# Patient Record
Sex: Female | Born: 1975 | Race: Black or African American | Hispanic: No | Marital: Single
Health system: Southern US, Community
[De-identification: ages and names within clinical notes are randomized; demographics above are authoritative.]

---

## 1997-08-04 ENCOUNTER — Other Ambulatory Visit: Admission: RE | Admit: 1997-08-04 | Discharge: 1997-08-04 | Payer: Self-pay | Admitting: Urology

## 1999-02-28 ENCOUNTER — Encounter: Payer: Self-pay | Admitting: Oral & Maxillofacial Surgery

## 1999-02-28 ENCOUNTER — Ambulatory Visit (HOSPITAL_COMMUNITY): Admission: RE | Admit: 1999-02-28 | Discharge: 1999-02-28 | Payer: Self-pay | Admitting: Oral & Maxillofacial Surgery

## 2001-05-31 ENCOUNTER — Inpatient Hospital Stay (HOSPITAL_COMMUNITY): Admission: EM | Admit: 2001-05-31 | Discharge: 2001-06-03 | Payer: Self-pay | Admitting: Emergency Medicine

## 2001-08-04 ENCOUNTER — Emergency Department (HOSPITAL_COMMUNITY): Admission: EM | Admit: 2001-08-04 | Discharge: 2001-08-04 | Payer: Self-pay | Admitting: Emergency Medicine

## 2001-08-21 ENCOUNTER — Encounter: Admission: RE | Admit: 2001-08-21 | Discharge: 2001-08-21 | Payer: Self-pay | Admitting: Internal Medicine

## 2001-12-19 ENCOUNTER — Observation Stay (HOSPITAL_COMMUNITY): Admission: RE | Admit: 2001-12-19 | Discharge: 2001-12-20 | Payer: Self-pay | Admitting: Internal Medicine

## 2002-08-15 ENCOUNTER — Encounter: Payer: Self-pay | Admitting: Emergency Medicine

## 2002-08-15 ENCOUNTER — Emergency Department (HOSPITAL_COMMUNITY): Admission: EM | Admit: 2002-08-15 | Discharge: 2002-08-16 | Payer: Self-pay | Admitting: Emergency Medicine

## 2006-09-11 ENCOUNTER — Other Ambulatory Visit: Admission: RE | Admit: 2006-09-11 | Discharge: 2006-09-11 | Payer: Self-pay | Admitting: Obstetrics and Gynecology

## 2007-11-05 ENCOUNTER — Ambulatory Visit (HOSPITAL_COMMUNITY): Admission: RE | Admit: 2007-11-05 | Discharge: 2007-11-05 | Payer: Self-pay | Admitting: Internal Medicine

## 2008-09-10 ENCOUNTER — Ambulatory Visit: Payer: Self-pay | Admitting: Internal Medicine

## 2008-09-10 ENCOUNTER — Inpatient Hospital Stay (HOSPITAL_COMMUNITY): Admission: EM | Admit: 2008-09-10 | Discharge: 2008-09-18 | Payer: Self-pay | Admitting: Emergency Medicine

## 2008-09-23 ENCOUNTER — Inpatient Hospital Stay (HOSPITAL_COMMUNITY): Admission: EM | Admit: 2008-09-23 | Discharge: 2008-10-08 | Payer: Self-pay | Admitting: Emergency Medicine

## 2008-09-23 ENCOUNTER — Encounter: Payer: Self-pay | Admitting: Internal Medicine

## 2008-09-23 ENCOUNTER — Ambulatory Visit: Payer: Self-pay | Admitting: Cardiology

## 2008-09-23 DIAGNOSIS — E876 Hypokalemia: Secondary | ICD-10-CM

## 2008-09-23 DIAGNOSIS — J189 Pneumonia, unspecified organism: Secondary | ICD-10-CM

## 2008-09-23 DIAGNOSIS — G8250 Quadriplegia, unspecified: Secondary | ICD-10-CM

## 2008-09-23 DIAGNOSIS — A4901 Methicillin susceptible Staphylococcus aureus infection, unspecified site: Secondary | ICD-10-CM | POA: Insufficient documentation

## 2008-09-23 DIAGNOSIS — J9610 Chronic respiratory failure, unspecified whether with hypoxia or hypercapnia: Secondary | ICD-10-CM

## 2008-10-10 ENCOUNTER — Telehealth: Payer: Self-pay | Admitting: Internal Medicine

## 2008-10-14 ENCOUNTER — Telehealth (INDEPENDENT_AMBULATORY_CARE_PROVIDER_SITE_OTHER): Payer: Self-pay | Admitting: *Deleted

## 2008-10-22 ENCOUNTER — Ambulatory Visit: Payer: Self-pay | Admitting: Internal Medicine

## 2008-10-22 ENCOUNTER — Encounter: Payer: Self-pay | Admitting: Critical Care Medicine

## 2008-10-22 ENCOUNTER — Ambulatory Visit (HOSPITAL_COMMUNITY): Admission: RE | Admit: 2008-10-22 | Discharge: 2008-10-22 | Payer: Self-pay | Admitting: *Deleted

## 2008-10-30 ENCOUNTER — Telehealth: Payer: Self-pay | Admitting: Critical Care Medicine

## 2008-11-03 ENCOUNTER — Encounter: Payer: Self-pay | Admitting: Internal Medicine

## 2008-11-04 ENCOUNTER — Encounter: Payer: Self-pay | Admitting: Critical Care Medicine

## 2008-11-16 ENCOUNTER — Encounter: Payer: Self-pay | Admitting: Critical Care Medicine

## 2008-11-24 ENCOUNTER — Encounter: Payer: Self-pay | Admitting: Internal Medicine

## 2008-12-15 ENCOUNTER — Encounter: Payer: Self-pay | Admitting: Critical Care Medicine

## 2008-12-19 ENCOUNTER — Encounter: Payer: Self-pay | Admitting: Critical Care Medicine

## 2008-12-22 ENCOUNTER — Ambulatory Visit: Payer: Self-pay | Admitting: Internal Medicine

## 2009-01-08 ENCOUNTER — Telehealth: Payer: Self-pay | Admitting: Internal Medicine

## 2009-01-12 ENCOUNTER — Telehealth: Payer: Self-pay | Admitting: Pulmonary Disease

## 2009-01-14 ENCOUNTER — Encounter: Payer: Self-pay | Admitting: Internal Medicine

## 2009-08-01 DEATH — deceased

## 2009-09-12 IMAGING — CR DG CHEST 1V PORT
1 series · 1 of 1 positions shown · non-contrast
Comparison: Portable chest x-rays 09/18/2008 and 09/15/2008.

CLINICAL DATA: Chronic ventilator dependent respiratory failure.
Shortness of breath and difficulty with ventilation.

PORTABLE CHEST - 1 VIEW [DATE]/[id number] hours:

[view not recorded]
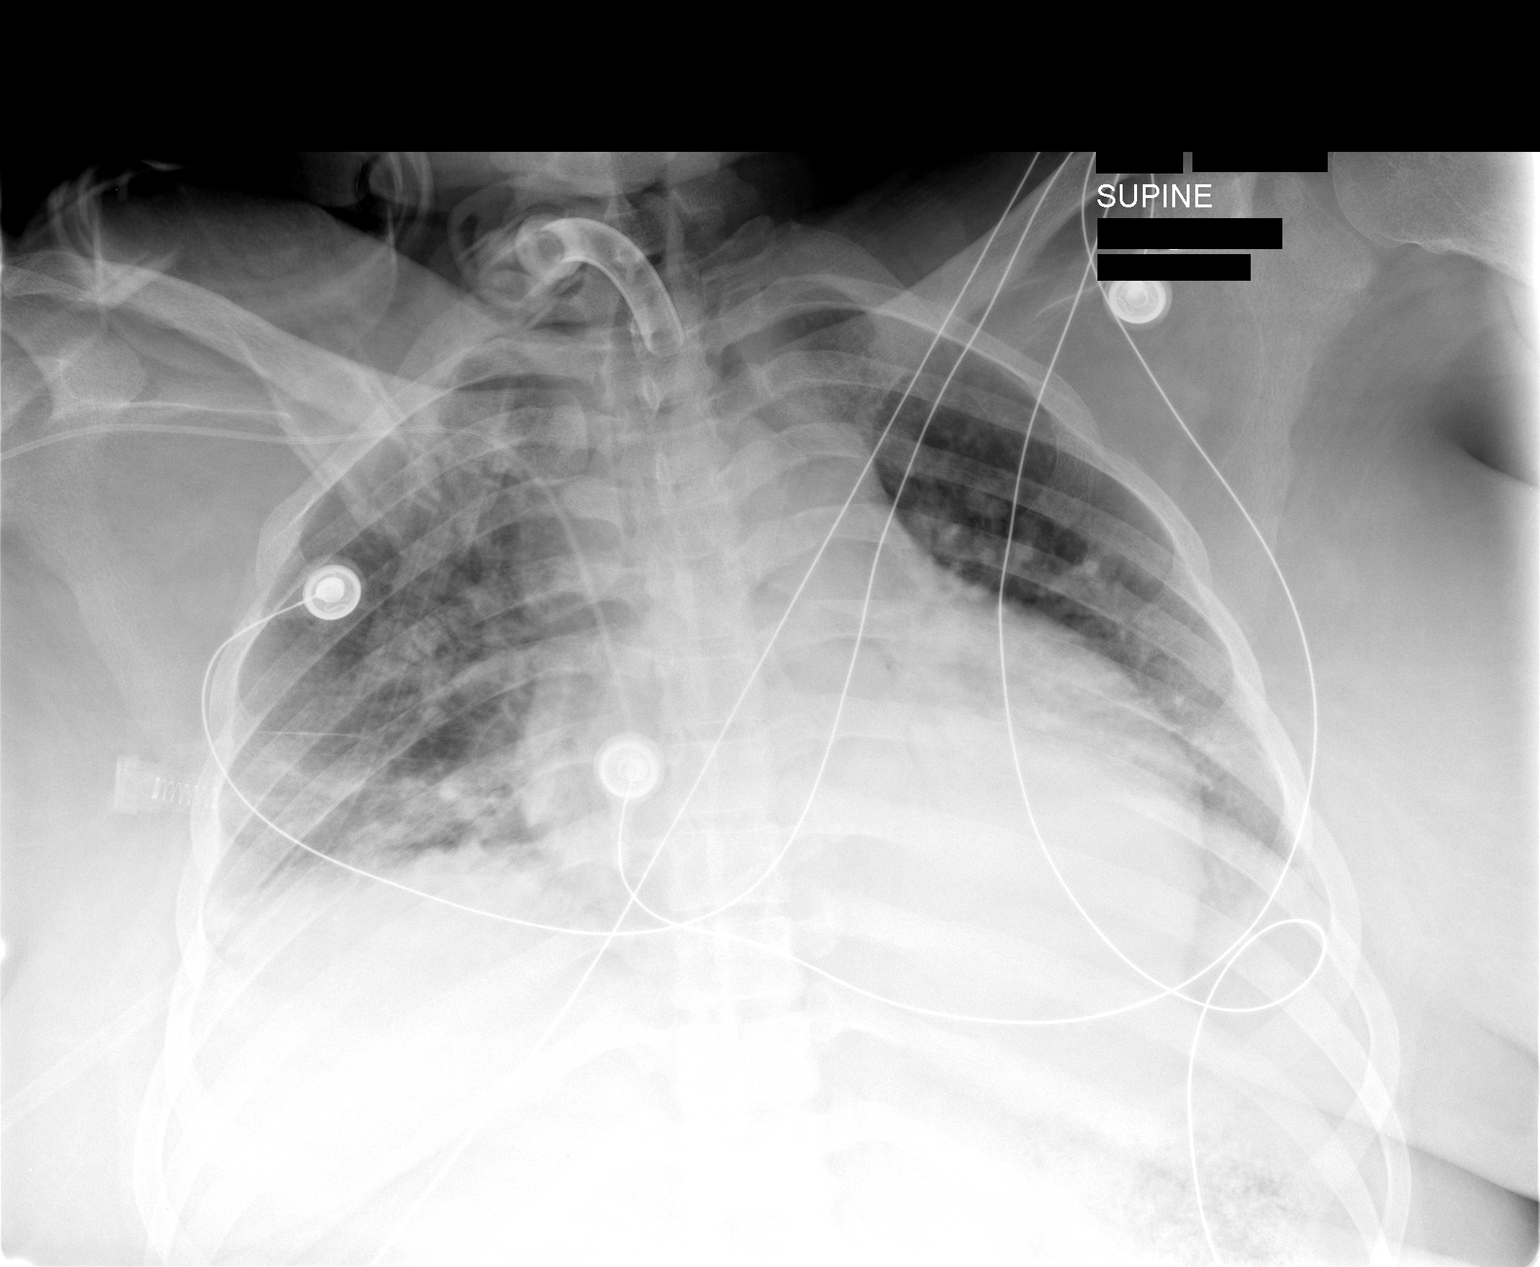

[1 of 1 positions shown; findings below may reference images not displayed]

FINDINGS: Tracheostomy tube tip in satisfactory position below the
thoracic inlet.  Heart enlarged but stable.  Pulmonary vascularity
normal without evidence pulmonary edema.  Dense consolidation in
the left lower lobe and patchy opacity at the right base, worse
than on the examination 5 days ago.  Right arm PICC tip remains in
the SVC.
IMPRESSION: Stable cardiomegaly.  Worsening consolidation in the left lower
lobe and worsening patchy airspace opacities in the right base,
atelectasis and/or pneumonia.

## 2009-09-15 IMAGING — CR DG CHEST 1V PORT
1 series · 1 of 1 positions shown · non-contrast
Comparison: 09/25/2008

CLINICAL DATA: Respiratory failure, hypoxia.

PORTABLE CHEST - 1 VIEW

[view not recorded]
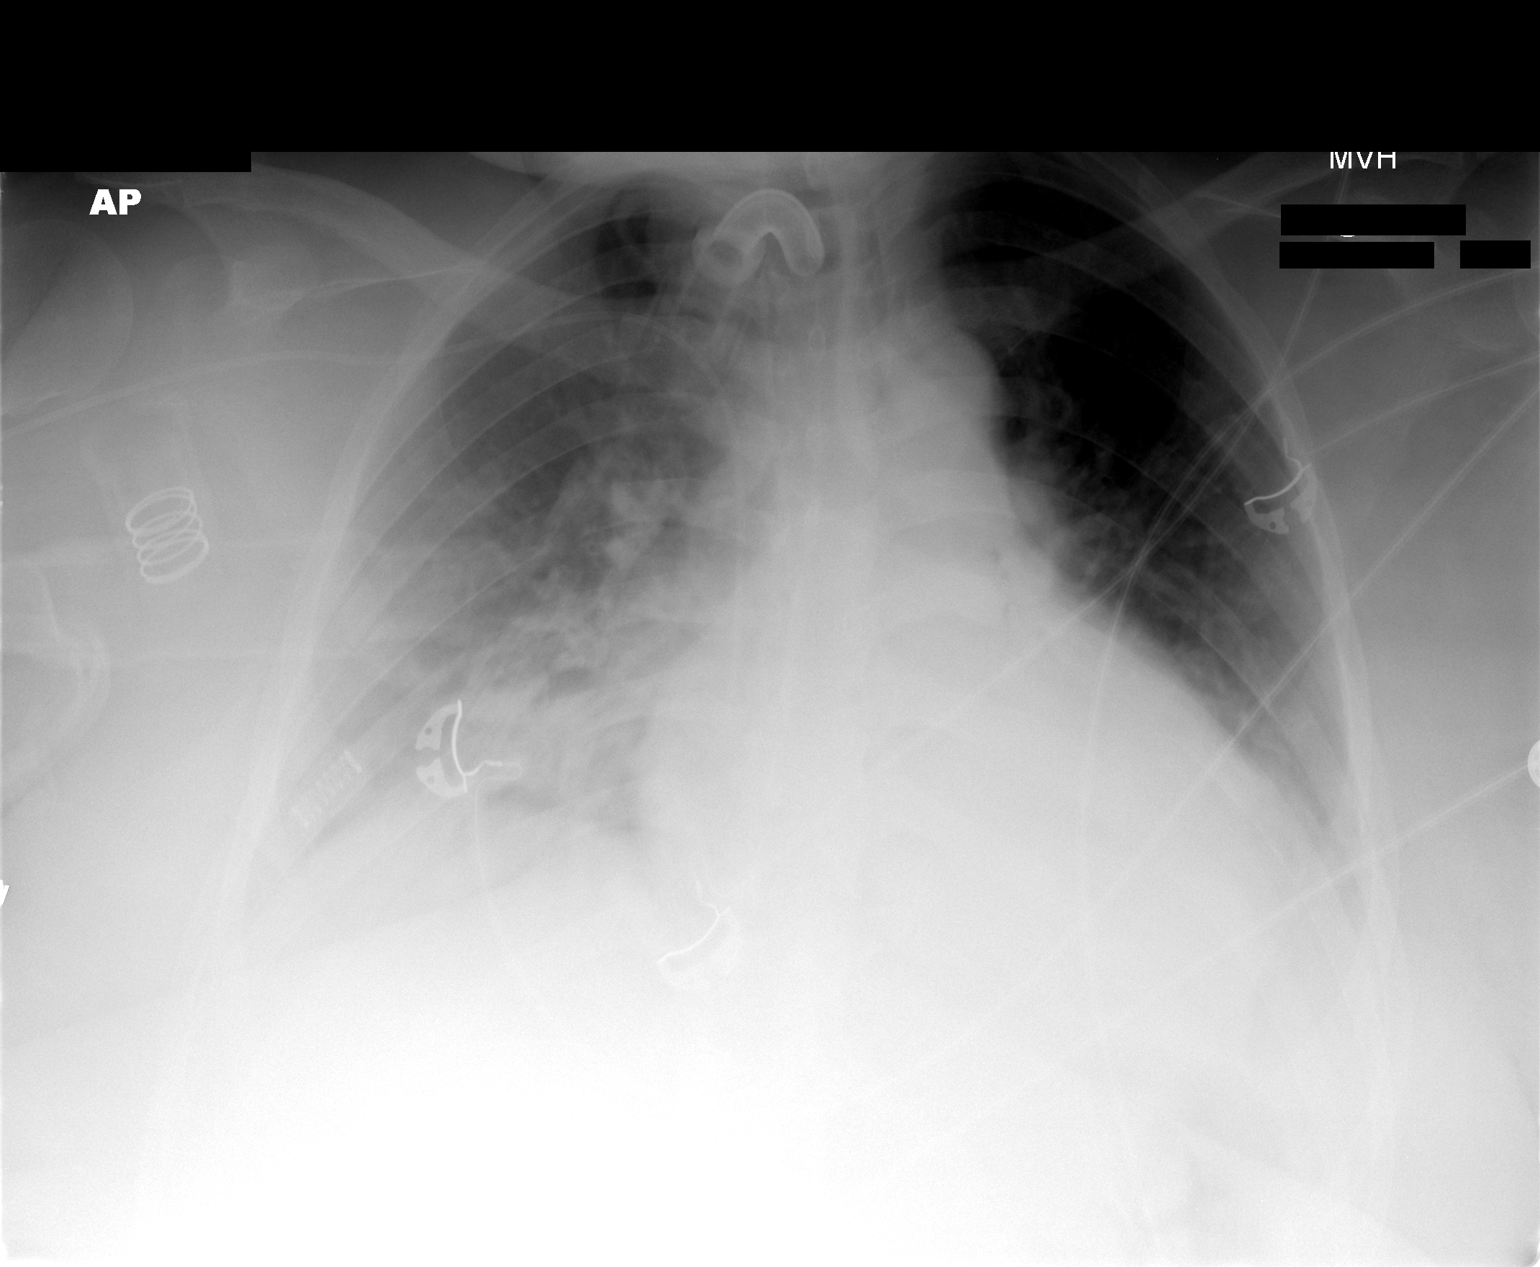

[1 of 1 positions shown; findings below may reference images not displayed]

FINDINGS: Support devices remain stable position.  There is
cardiomegaly.  Mild vascular congestion noted.  Increasing
bibasilar opacities bilaterally. No acute bony abnormality.
IMPRESSION: Cardiomegaly, vascular congestion.

Increasing bibasilar opacities.

## 2009-09-16 IMAGING — CR DG CHEST 1V PORT
1 series · 1 of 1 positions shown · non-contrast
Comparison: Yesterday.

CLINICAL DATA: Restroom failure.  Pneumonia.

PORTABLE CHEST - 1 VIEW

[view not recorded]
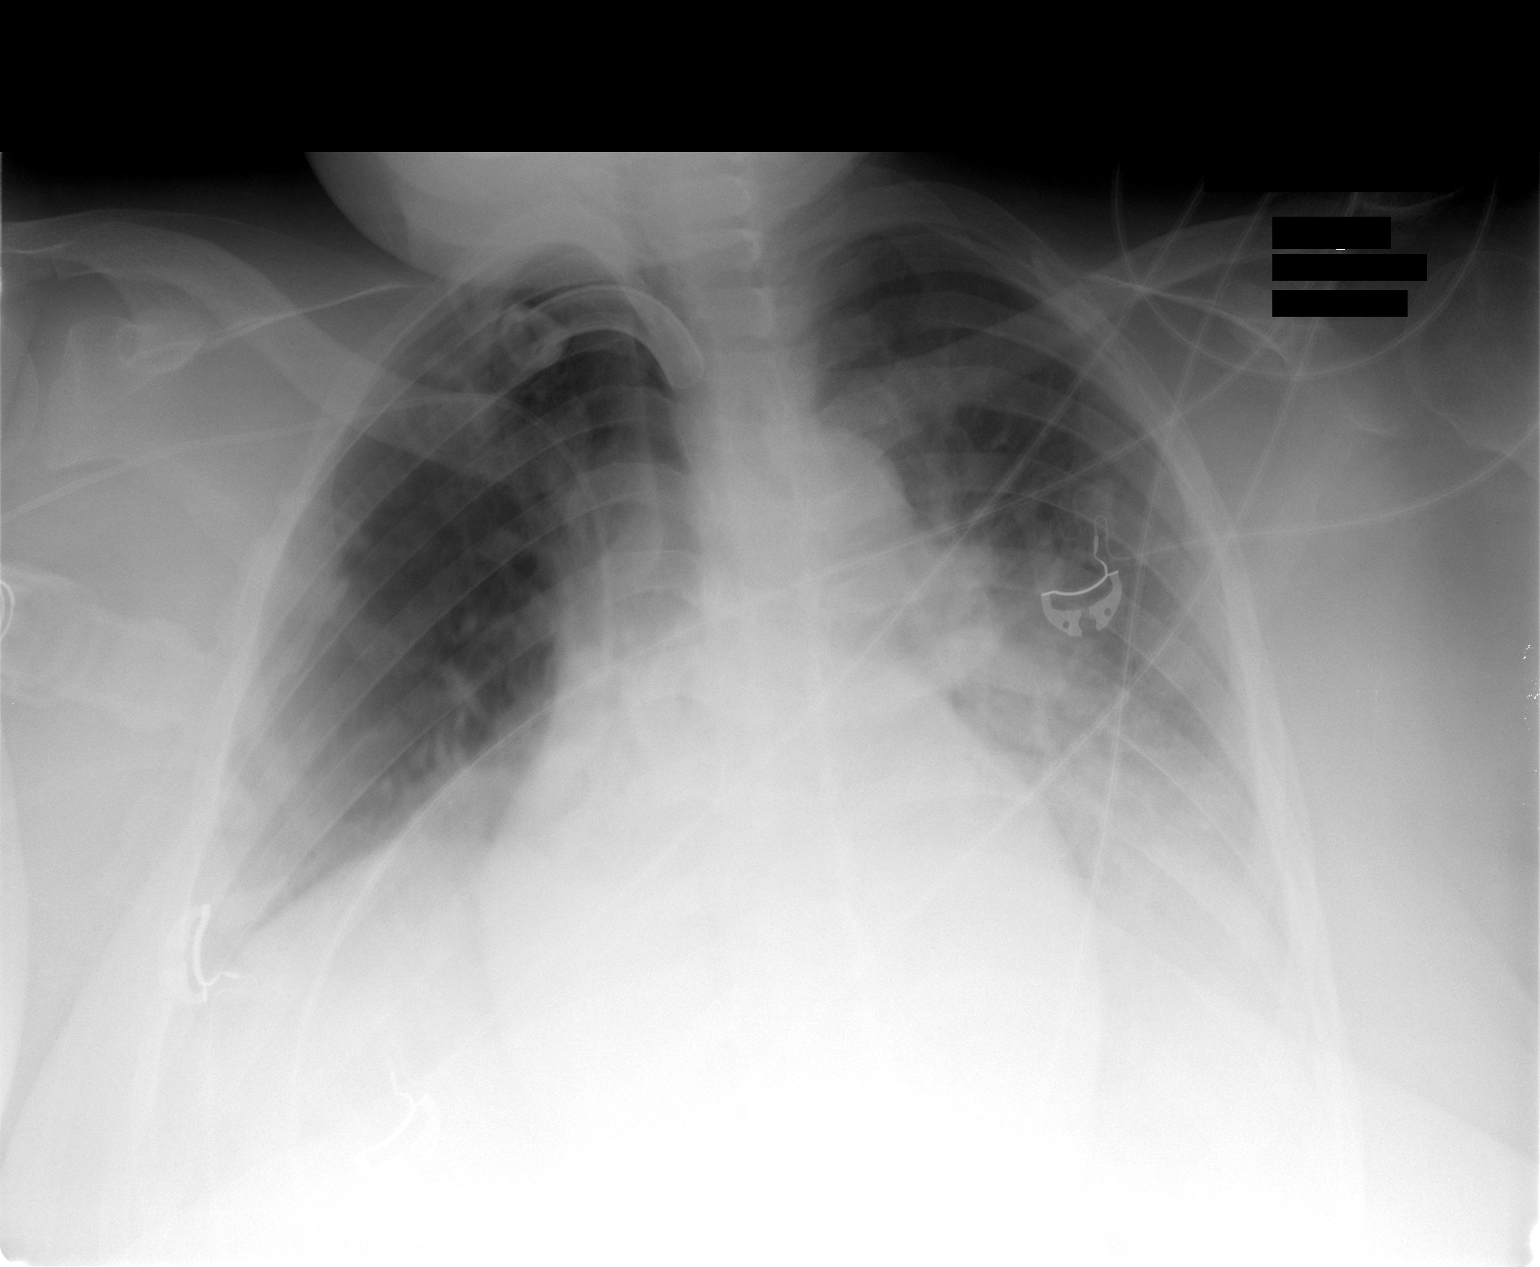

[1 of 1 positions shown; findings below may reference images not displayed]

FINDINGS: Tracheostomy tube in satisfactory position.  Stable right
PICC.  Stable enlarged cardiac silhouette.  Decreased bibasilar
opacity.
IMPRESSION: 1.  Decreased bibasilar atelectasis and possible pneumonia.
2.  Stable cardiomegaly.

## 2010-07-10 LAB — COMPREHENSIVE METABOLIC PANEL
ALT: 20 U/L (ref 0–35)
ALT: 21 U/L (ref 0–35)
AST: 20 U/L (ref 0–37)
AST: 27 U/L (ref 0–37)
Albumin: 2.9 g/dL — ABNORMAL LOW (ref 3.5–5.2)
Albumin: 3 g/dL — ABNORMAL LOW (ref 3.5–5.2)
Albumin: 3.1 g/dL — ABNORMAL LOW (ref 3.5–5.2)
BUN: 13 mg/dL (ref 6–23)
CO2: 26 mEq/L (ref 19–32)
Calcium: 8.8 mg/dL (ref 8.4–10.5)
Chloride: 100 mEq/L (ref 96–112)
Creatinine, Ser: 1.71 mg/dL — ABNORMAL HIGH (ref 0.4–1.2)
Creatinine, Ser: 2.57 mg/dL — ABNORMAL HIGH (ref 0.4–1.2)
GFR calc Af Amer: 26 mL/min — ABNORMAL LOW (ref >60)
GFR calc Af Amer: 31 mL/min — ABNORMAL LOW (ref >60)
GFR calc non Af Amer: 21 mL/min — ABNORMAL LOW (ref >60)
Sodium: 142 mEq/L (ref 135–145)
Sodium: 142 mEq/L (ref 135–145)
Total Bilirubin: 0.6 mg/dL (ref 0.3–1.2)
Total Protein: 8.2 g/dL (ref 6.0–8.3)
Total Protein: 8.5 g/dL — ABNORMAL HIGH (ref 6.0–8.3)

## 2010-07-10 LAB — BLOOD GAS, ARTERIAL
Acid-Base Excess: 4.8 mmol/L — ABNORMAL HIGH (ref 0.0–2.0)
Bicarbonate: 26.7 mEq/L — ABNORMAL HIGH (ref 20.0–24.0)
Bicarbonate: 28.3 mEq/L — ABNORMAL HIGH (ref 20.0–24.0)
Drawn by: 295031
Drawn by: 308601
FIO2: 0.28 %
MECHVT: 650 mL
MECHVT: 650 mL
MECHVT: 650 mL
O2 Saturation: 98.1 %
O2 Saturation: 99.3 %
PEEP: 5 cmH2O
PEEP: 5 cmH2O
PEEP: 5 cmH2O
Patient temperature: 98.6
RATE: 16 resp/min
pCO2 arterial: 30.7 mmHg — ABNORMAL LOW (ref 35.0–45.0)
pH, Arterial: 7.545 — ABNORMAL HIGH (ref 7.350–7.400)
pO2, Arterial: 142 mmHg — ABNORMAL HIGH (ref 80.0–100.0)
pO2, Arterial: 88.3 mmHg (ref 80.0–100.0)

## 2010-07-10 LAB — CBC
HCT: 27 % — ABNORMAL LOW (ref 36.0–46.0)
HCT: 27.7 % — ABNORMAL LOW (ref 36.0–46.0)
HCT: 29.5 % — ABNORMAL LOW (ref 36.0–46.0)
Hemoglobin: 8.6 g/dL — ABNORMAL LOW (ref 12.0–15.0)
Hemoglobin: 8.7 g/dL — ABNORMAL LOW (ref 12.0–15.0)
Hemoglobin: 9.5 g/dL — ABNORMAL LOW (ref 12.0–15.0)
MCHC: 32.1 g/dL (ref 30.0–36.0)
MCHC: 32.2 g/dL (ref 30.0–36.0)
MCV: 73.1 fL — ABNORMAL LOW (ref 78.0–100.0)
MCV: 73.3 fL — ABNORMAL LOW (ref 78.0–100.0)
Platelets: 388 10*3/uL (ref 150–400)
Platelets: 444 10*3/uL — ABNORMAL HIGH (ref 150–400)
Platelets: 459 10*3/uL — ABNORMAL HIGH (ref 150–400)
RBC: 4.02 MIL/uL (ref 3.87–5.11)
RBC: 4.38 MIL/uL (ref 3.87–5.11)
RDW: 21.8 % — ABNORMAL HIGH (ref 11.5–15.5)
RDW: 22 % — ABNORMAL HIGH (ref 11.5–15.5)
RDW: 22.4 % — ABNORMAL HIGH (ref 11.5–15.5)
WBC: 9.5 10*3/uL (ref 4.0–10.5)

## 2010-07-10 LAB — BASIC METABOLIC PANEL
BUN: 15 mg/dL (ref 6–23)
BUN: 5 mg/dL — ABNORMAL LOW (ref 6–23)
BUN: 6 mg/dL (ref 6–23)
CO2: 25 mEq/L (ref 19–32)
CO2: 26 mEq/L (ref 19–32)
Calcium: 8.8 mg/dL (ref 8.4–10.5)
Chloride: 106 mEq/L (ref 96–112)
Chloride: 98 mEq/L (ref 96–112)
Creatinine, Ser: 1.08 mg/dL (ref 0.4–1.2)
Creatinine, Ser: 1.1 mg/dL (ref 0.4–1.2)
GFR calc Af Amer: >60 mL/min (ref >60)
GFR calc non Af Amer: 33 mL/min — ABNORMAL LOW (ref >60)
GFR calc non Af Amer: 58 mL/min — ABNORMAL LOW (ref >60)
Glucose, Bld: 114 mg/dL — ABNORMAL HIGH (ref 70–99)
Glucose, Bld: 124 mg/dL — ABNORMAL HIGH (ref 70–99)
Glucose, Bld: 95 mg/dL (ref 70–99)
Potassium: 2.6 mEq/L — CL (ref 3.5–5.1)
Potassium: 3 mEq/L — ABNORMAL LOW (ref 3.5–5.1)
Sodium: 138 mEq/L (ref 135–145)
Sodium: 140 mEq/L (ref 135–145)

## 2010-07-10 LAB — PHOSPHORUS: Phosphorus: 2.6 mg/dL (ref 2.3–4.6)

## 2010-07-10 LAB — CULTURE, BAL-QUANTITATIVE W GRAM STAIN: Colony Count: >=100000

## 2010-07-10 LAB — MAGNESIUM
Magnesium: 1.8 mg/dL (ref 1.5–2.5)
Magnesium: 1.9 mg/dL (ref 1.5–2.5)

## 2010-07-11 LAB — URINALYSIS, ROUTINE W REFLEX MICROSCOPIC
Glucose, UA: NEGATIVE mg/dL
Ketones, ur: NEGATIVE mg/dL
Nitrite: NEGATIVE
Protein, ur: 30 mg/dL — AB
Specific Gravity, Urine: 1.021 (ref 1.005–1.030)
Urobilinogen, UA: 1 mg/dL (ref 0.0–1.0)
Urobilinogen, UA: 1 mg/dL (ref 0.0–1.0)
pH: 6 (ref 5.0–8.0)

## 2010-07-11 LAB — COMPREHENSIVE METABOLIC PANEL
ALT: 23 U/L (ref 0–35)
ALT: 25 U/L (ref 0–35)
ALT: 56 U/L — ABNORMAL HIGH (ref 0–35)
ALT: 72 U/L — ABNORMAL HIGH (ref 0–35)
AST: 32 U/L (ref 0–37)
AST: 54 U/L — ABNORMAL HIGH (ref 0–37)
AST: 62 U/L — ABNORMAL HIGH (ref 0–37)
AST: 59 U/L — ABNORMAL HIGH (ref 0–37)
Albumin: 2.9 g/dL — ABNORMAL LOW (ref 3.5–5.2)
Albumin: 3 g/dL — ABNORMAL LOW (ref 3.5–5.2)
Alkaline Phosphatase: 106 U/L (ref 39–117)
Alkaline Phosphatase: 94 U/L (ref 39–117)
Alkaline Phosphatase: 95 U/L (ref 39–117)
Alkaline Phosphatase: 115 U/L (ref 39–117)
BUN: 11 mg/dL (ref 6–23)
BUN: 12 mg/dL (ref 6–23)
BUN: 6 mg/dL (ref 6–23)
CO2: 24 mEq/L (ref 19–32)
CO2: 27 mEq/L (ref 19–32)
CO2: 27 mEq/L (ref 19–32)
CO2: 25 mEq/L (ref 19–32)
Calcium: 8.6 mg/dL (ref 8.4–10.5)
Calcium: 8.7 mg/dL (ref 8.4–10.5)
Calcium: 9.1 mg/dL (ref 8.4–10.5)
Chloride: 102 mEq/L (ref 96–112)
Chloride: 104 mEq/L (ref 96–112)
Chloride: 106 mEq/L (ref 96–112)
Creatinine, Ser: 0.53 mg/dL (ref 0.4–1.2)
Creatinine, Ser: 2.12 mg/dL — ABNORMAL HIGH (ref 0.4–1.2)
GFR calc Af Amer: >60 mL/min (ref >60)
GFR calc Af Amer: >60 mL/min (ref >60)
GFR calc Af Amer: >60 mL/min (ref >60)
GFR calc non Af Amer: 25 mL/min — ABNORMAL LOW (ref >60)
GFR calc non Af Amer: 27 mL/min — ABNORMAL LOW (ref >60)
GFR calc non Af Amer: >60 mL/min (ref >60)
GFR calc non Af Amer: >60 mL/min (ref >60)
Glucose, Bld: 105 mg/dL — ABNORMAL HIGH (ref 70–99)
Glucose, Bld: 131 mg/dL — ABNORMAL HIGH (ref 70–99)
Glucose, Bld: 99 mg/dL (ref 70–99)
Glucose, Bld: 86 mg/dL (ref 70–99)
Potassium: 3.2 mEq/L — ABNORMAL LOW (ref 3.5–5.1)
Potassium: 3.4 mEq/L — ABNORMAL LOW (ref 3.5–5.1)
Potassium: 3.7 mEq/L (ref 3.5–5.1)
Sodium: 138 mEq/L (ref 135–145)
Sodium: 139 mEq/L (ref 135–145)
Sodium: 140 mEq/L (ref 135–145)
Sodium: 144 mEq/L (ref 135–145)
Sodium: 140 mEq/L (ref 135–145)
Total Bilirubin: 0.6 mg/dL (ref 0.3–1.2)
Total Bilirubin: 0.6 mg/dL (ref 0.3–1.2)
Total Bilirubin: 0.4 mg/dL (ref 0.3–1.2)
Total Protein: 7.4 g/dL (ref 6.0–8.3)
Total Protein: 8 g/dL (ref 6.0–8.3)
Total Protein: 8.3 g/dL (ref 6.0–8.3)
Total Protein: 8.6 g/dL — ABNORMAL HIGH (ref 6.0–8.3)

## 2010-07-11 LAB — CBC
HCT: 28.1 % — ABNORMAL LOW (ref 36.0–46.0)
HCT: 28.6 % — ABNORMAL LOW (ref 36.0–46.0)
HCT: 29.8 % — ABNORMAL LOW (ref 36.0–46.0)
HCT: 31.4 % — ABNORMAL LOW (ref 36.0–46.0)
HCT: 31.8 % — ABNORMAL LOW (ref 36.0–46.0)
Hemoglobin: 10.2 g/dL — ABNORMAL LOW (ref 12.0–15.0)
Hemoglobin: 10.6 g/dL — ABNORMAL LOW (ref 12.0–15.0)
Hemoglobin: 8.7 g/dL — ABNORMAL LOW (ref 12.0–15.0)
Hemoglobin: 8.8 g/dL — ABNORMAL LOW (ref 12.0–15.0)
Hemoglobin: 8.8 g/dL — ABNORMAL LOW (ref 12.0–15.0)
Hemoglobin: 8.9 g/dL — ABNORMAL LOW (ref 12.0–15.0)
Hemoglobin: 9 g/dL — ABNORMAL LOW (ref 12.0–15.0)
Hemoglobin: 9.4 g/dL — ABNORMAL LOW (ref 12.0–15.0)
Hemoglobin: 9.7 g/dL — ABNORMAL LOW (ref 12.0–15.0)
Hemoglobin: 9.8 g/dL — ABNORMAL LOW (ref 12.0–15.0)
Hemoglobin: 11 g/dL — ABNORMAL LOW (ref 12.0–15.0)
MCHC: 31.2 g/dL (ref 30.0–36.0)
MCHC: 31.6 g/dL (ref 30.0–36.0)
MCHC: 31.9 g/dL (ref 30.0–36.0)
MCHC: 31.9 g/dL (ref 30.0–36.0)
MCHC: 32.2 g/dL (ref 30.0–36.0)
MCHC: 32.3 g/dL (ref 30.0–36.0)
MCV: 73.4 fL — ABNORMAL LOW (ref 78.0–100.0)
MCV: 73.7 fL — ABNORMAL LOW (ref 78.0–100.0)
MCV: 74.1 fL — ABNORMAL LOW (ref 78.0–100.0)
MCV: 74.1 fL — ABNORMAL LOW (ref 78.0–100.0)
MCV: 74.2 fL — ABNORMAL LOW (ref 78.0–100.0)
Platelets: 448 10*3/uL — ABNORMAL HIGH (ref 150–400)
RBC: 3.7 MIL/uL — ABNORMAL LOW (ref 3.87–5.11)
RBC: 3.82 MIL/uL — ABNORMAL LOW (ref 3.87–5.11)
RBC: 3.85 MIL/uL — ABNORMAL LOW (ref 3.87–5.11)
RBC: 4.16 MIL/uL (ref 3.87–5.11)
RBC: 4.26 MIL/uL (ref 3.87–5.11)
RBC: 4.32 MIL/uL (ref 3.87–5.11)
RBC: 4.7 MIL/uL (ref 3.87–5.11)
RDW: 22.2 % — ABNORMAL HIGH (ref 11.5–15.5)
RDW: 22.2 % — ABNORMAL HIGH (ref 11.5–15.5)
RDW: 23 % — ABNORMAL HIGH (ref 11.5–15.5)
RDW: 23 % — ABNORMAL HIGH (ref 11.5–15.5)
RDW: 23 % — ABNORMAL HIGH (ref 11.5–15.5)
RDW: 23.2 % — ABNORMAL HIGH (ref 11.5–15.5)
RDW: 23.3 % — ABNORMAL HIGH (ref 11.5–15.5)
RDW: 23.5 % — ABNORMAL HIGH (ref 11.5–15.5)
WBC: 10.2 10*3/uL (ref 4.0–10.5)
WBC: 6.2 10*3/uL (ref 4.0–10.5)
WBC: 8.5 10*3/uL (ref 4.0–10.5)
WBC: 8.9 10*3/uL (ref 4.0–10.5)
WBC: 9.7 10*3/uL (ref 4.0–10.5)
WBC: 8.4 10*3/uL (ref 4.0–10.5)

## 2010-07-11 LAB — BASIC METABOLIC PANEL
BUN: 6 mg/dL (ref 6–23)
CO2: 22 mEq/L (ref 19–32)
CO2: 23 mEq/L (ref 19–32)
CO2: 24 mEq/L (ref 19–32)
CO2: 24 mEq/L (ref 19–32)
Calcium: 8.2 mg/dL — ABNORMAL LOW (ref 8.4–10.5)
Calcium: 8.3 mg/dL — ABNORMAL LOW (ref 8.4–10.5)
Calcium: 8.5 mg/dL (ref 8.4–10.5)
Calcium: 8.5 mg/dL (ref 8.4–10.5)
Calcium: 8.8 mg/dL (ref 8.4–10.5)
Chloride: 108 mEq/L (ref 96–112)
Chloride: 112 mEq/L (ref 96–112)
Chloride: 112 mEq/L (ref 96–112)
Creatinine, Ser: 0.36 mg/dL — ABNORMAL LOW (ref 0.4–1.2)
Creatinine, Ser: 1.23 mg/dL — ABNORMAL HIGH (ref 0.4–1.2)
Creatinine, Ser: 1.63 mg/dL — ABNORMAL HIGH (ref 0.4–1.2)
Creatinine, Ser: 1.9 mg/dL — ABNORMAL HIGH (ref 0.4–1.2)
GFR calc Af Amer: 35 mL/min — ABNORMAL LOW (ref >60)
GFR calc Af Amer: 37 mL/min — ABNORMAL LOW (ref >60)
GFR calc Af Amer: >60 mL/min (ref >60)
GFR calc non Af Amer: 29 mL/min — ABNORMAL LOW (ref >60)
GFR calc non Af Amer: 30 mL/min — ABNORMAL LOW (ref >60)
Glucose, Bld: 107 mg/dL — ABNORMAL HIGH (ref 70–99)
Glucose, Bld: 109 mg/dL — ABNORMAL HIGH (ref 70–99)
Glucose, Bld: 98 mg/dL (ref 70–99)
Potassium: 4 mEq/L (ref 3.5–5.1)
Potassium: 4 mEq/L (ref 3.5–5.1)
Sodium: 139 mEq/L (ref 135–145)
Sodium: 140 mEq/L (ref 135–145)
Sodium: 141 mEq/L (ref 135–145)

## 2010-07-11 LAB — BLOOD GAS, ARTERIAL
Acid-Base Excess: 1.5 mmol/L (ref 0.0–2.0)
Acid-Base Excess: 1.8 mmol/L (ref 0.0–2.0)
Acid-base deficit: 1.2 mmol/L (ref 0.0–2.0)
Acid-base deficit: 2 mmol/L (ref 0.0–2.0)
Bicarbonate: 24.2 mEq/L — ABNORMAL HIGH (ref 20.0–24.0)
Bicarbonate: 25.1 mEq/L — ABNORMAL HIGH (ref 20.0–24.0)
Bicarbonate: 26.1 mEq/L — ABNORMAL HIGH (ref 20.0–24.0)
Drawn by: 229971
Drawn by: 232811
Drawn by: 317771
FIO2: 0.21 %
FIO2: 0.28 %
FIO2: 0.28 %
MECHVT: 0.65 mL
MECHVT: 650 mL
MECHVT: 650 mL
O2 Saturation: 93.6 %
O2 Saturation: 97.7 %
O2 Saturation: 97.9 %
O2 Saturation: 99.2 %
PEEP: 5 cmH2O
PEEP: 5 cmH2O
PEEP: 5 cmH2O
PEEP: 5 cmH2O
Patient temperature: 101.5
Patient temperature: 98.6
Patient temperature: 99.6
RATE: 11 resp/min
RATE: 12 resp/min
RATE: 14 resp/min
RATE: 16 resp/min
TCO2: 20.6 mmol/L (ref 0–100)
TCO2: 22.3 mmol/L (ref 0–100)
TCO2: 22.7 mmol/L (ref 0–100)
TCO2: 25.8 mmol/L (ref 0–100)
pCO2 arterial: 40.5 mmHg (ref 35.0–45.0)
pCO2 arterial: 64.2 mmHg (ref 35.0–45.0)
pH, Arterial: 7.421 — ABNORMAL HIGH (ref 7.350–7.400)
pH, Arterial: 7.519 — ABNORMAL HIGH (ref 7.350–7.400)
pO2, Arterial: 31.7 mmHg — CL (ref 80.0–100.0)
pO2, Arterial: 66.4 mmHg — ABNORMAL LOW (ref 80.0–100.0)
pO2, Arterial: 71 mmHg — ABNORMAL LOW (ref 80.0–100.0)
pO2, Arterial: 97.2 mmHg (ref 80.0–100.0)
pO2, Arterial: 98.5 mmHg (ref 80.0–100.0)

## 2010-07-11 LAB — CULTURE, BLOOD (ROUTINE X 2): Culture: NO GROWTH

## 2010-07-11 LAB — URINE CULTURE
Colony Count: NO GROWTH
Colony Count: NO GROWTH
Culture: NO GROWTH
Special Requests: NEGATIVE

## 2010-07-11 LAB — BRAIN NATRIURETIC PEPTIDE: Pro B Natriuretic peptide (BNP): <30 pg/mL (ref 0.0–100.0)

## 2010-07-11 LAB — CULTURE, BAL-QUANTITATIVE W GRAM STAIN: Colony Count: NO GROWTH

## 2010-07-11 LAB — POCT I-STAT, CHEM 8
BUN: 4 mg/dL — ABNORMAL LOW (ref 6–23)
Calcium, Ion: 1.02 mmol/L — ABNORMAL LOW (ref 1.12–1.32)
Chloride: 105 mEq/L (ref 96–112)
Creatinine, Ser: 0.4 mg/dL (ref 0.4–1.2)
Glucose, Bld: 127 mg/dL — ABNORMAL HIGH (ref 70–99)

## 2010-07-11 LAB — CULTURE, RESPIRATORY W GRAM STAIN

## 2010-07-11 LAB — URINE MICROSCOPIC-ADD ON

## 2010-07-11 LAB — FERRITIN: Ferritin: 165 ng/mL (ref 10–291)

## 2010-07-11 LAB — TSH: TSH: 1.486 u[IU]/mL (ref 0.350–4.500)

## 2010-07-11 LAB — CULTURE, BLOOD (SINGLE)

## 2010-07-11 LAB — SODIUM, URINE, RANDOM: Sodium, Ur: 43 mEq/L

## 2010-07-11 LAB — DIFFERENTIAL
Basophils Absolute: 0.1 10*3/uL (ref 0.0–0.1)
Basophils Absolute: 0 10*3/uL (ref 0.0–0.1)
Basophils Relative: 0 % (ref 0–1)
Eosinophils Absolute: 0.2 10*3/uL (ref 0.0–0.7)
Eosinophils Relative: 2 % (ref 0–5)
Lymphocytes Relative: 21 % (ref 12–46)
Lymphs Abs: 3.8 10*3/uL (ref 0.7–4.0)
Monocytes Relative: 3 % (ref 3–12)
Neutro Abs: 7.5 10*3/uL (ref 1.7–7.7)
Neutrophils Relative %: 45 % (ref 43–77)

## 2010-07-11 LAB — LEGIONELLA ANTIGEN, URINE: Legionella Antigen, Urine: NEGATIVE

## 2010-07-11 LAB — CREATININE, URINE, RANDOM: Creatinine, Urine: 25.2 mg/dL

## 2010-07-11 LAB — IRON AND TIBC: TIBC: 254 ug/dL (ref 250–470)
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
## 2014-01-23 ENCOUNTER — Telehealth: Payer: Self-pay

## 2014-01-23 NOTE — Telephone Encounter (Signed)
Patient died per Social Security Death Site °
